# Patient Record
Sex: Female | Born: 1950 | Race: White | Hispanic: No | Marital: Married | Smoking: Current every day smoker
Health system: Southern US, Community
[De-identification: ages and names within clinical notes are randomized; demographics above are authoritative.]

## PROBLEM LIST (undated history)

## (undated) DIAGNOSIS — M797 Fibromyalgia: Secondary | ICD-10-CM

## (undated) DIAGNOSIS — E0500 Thyrotoxicosis with diffuse goiter without thyrotoxic crisis or storm: Secondary | ICD-10-CM

---

## 2008-06-28 ENCOUNTER — Ambulatory Visit: Payer: Self-pay | Admitting: Occupational Medicine

## 2008-06-28 LAB — CONVERTED CEMR LAB
Blood in Urine, dipstick: NEGATIVE
Nitrite: POSITIVE
pH: 8.5

## 2010-03-01 ENCOUNTER — Ambulatory Visit: Payer: Self-pay | Admitting: Emergency Medicine

## 2010-03-01 DIAGNOSIS — J209 Acute bronchitis, unspecified: Secondary | ICD-10-CM

## 2010-07-13 NOTE — Assessment & Plan Note (Signed)
Summary: COUGH,CONGESTION,HEADACHE/TJ   Vital Signs:  Patient Profile:   60 Years Old Female CC:      Cough, congestion, headache x 1 week Height:     65 inches Weight:      195 pounds O2 Sat:      93 % O2 treatment:    Room Air Temp:     97.5 degrees F oral Pulse rate:   104 / minute Pulse rhythm:   regular Resp:     18 per minute BP sitting:   136 / 84  (left arm) Cuff size:   large  Vitals Entered By: Avel Sensor, CMA                  Updated Prior Medication List: SYNTHROID 100 MCG TABS (LEVOTHYROXINE SODIUM) once a day TRILIPIX 135 MG CPDR (CHOLINE FENOFIBRATE) once a day  Current Allergies (reviewed today): ! PENICILLINHistory of Present Illness History from: patient Chief Complaint: Cough, congestion, headache x 1 week History of Present Illness: Cough, chest congestion, nasal congestion, HA, runny nose. No F/C/N/V.  Starting to hurt from all the coughing.  She is taking OTC Mucinex and Robitussin. She cannot take codeine.  Current Meds SYNTHROID 100 MCG TABS (LEVOTHYROXINE SODIUM) once a day ZITHROMAX 250 MG TABS (AZITHROMYCIN) 2 tabs by mouth on day 1, then 1 tab for 10 days TESSALON 200 MG CAPS (BENZONATATE) 1 tab by mouth three times a day as needed for cough MUCUS RELIEF PE 10-400 MG TABS (PHENYLEPHRINE-GUAIFENESIN) 1 tab by mouth three times a day as needed for congestion  REVIEW OF SYSTEMS Constitutional Symptoms      Denies fever, chills, night sweats, weight loss, weight gain, and fatigue.  Eyes       Denies change in vision, eye pain, eye discharge, glasses, contact lenses, and eye surgery. Ear/Nose/Throat/Mouth       Complains of frequent runny nose and hoarseness.      Denies hearing loss/aids, change in hearing, ear pain, ear discharge, dizziness, frequent nose bleeds, sinus problems, sore throat, and tooth pain or bleeding.  Respiratory       Complains of dry cough.      Denies productive cough, wheezing, shortness of breath, asthma, bronchitis,  and emphysema/COPD.  Cardiovascular       Denies murmurs, chest pain, and tires easily with exhertion.    Gastrointestinal       Complains of stomach pain.      Denies nausea/vomiting, diarrhea, constipation, blood in bowel movements, and indigestion. Genitourniary       Denies painful urination, kidney stones, and loss of urinary control. Neurological       Complains of headaches.      Denies paralysis, seizures, and fainting/blackouts. Musculoskeletal       Denies muscle pain, joint pain, joint stiffness, decreased range of motion, redness, swelling, muscle weakness, and gout.  Skin       Denies bruising, unusual mles/lumps or sores, and hair/skin or nail changes.  Psych       Denies mood changes, temper/anger issues, anxiety/stress, speech problems, depression, and sleep problems.  Past History:  Past Medical History: Reviewed history from 06/28/2008 and no changes required. high cholesterol fibromyalgia  Past Surgical History: Reviewed history from 06/28/2008 and no changes required. Hysterectomy  Family History: Reviewed history from 06/28/2008 and no changes required. mothr alive and diabetic father deceased - heart attack two brothers and two sisters alive and healthy  Social History: Reviewed history from 06/28/2008 and no changes required.  2-3 cigarette a day smoker, 25 yrs, ETOH-yes No Drugs Physical Exam General appearance: well developed, well nourished, no acute distress Ears: normal, no lesions or deformities Nasal: mucosa pink, nonedematous, no septal deviation, turbinates normal Oral/Pharynx: tongue normal, posterior pharynx without erythema or exudate Neck: neck supple,  trachea midline, no masses Chest/Lungs: scattered rhonchi, no wheezing Heart: regular rate and  rhythm, no murmur Skin: no obvious rashes or lesions MSE: oriented to time, place, and person Assessment New Problems: BRONCHITIS, ACUTE (ICD-466.0)   Patient Education: Patient  and/or caregiver instructed in the following: rest, fluids, Tylenol prn, quit smoking.  Plan New Medications/Changes: MUCUS RELIEF PE 10-400 MG TABS (PHENYLEPHRINE-GUAIFENESIN) 1 tab by mouth three times a day as needed for congestion  #30 x 0, 03/01/2010, Hoyt Koch MD TESSALON 200 MG CAPS (BENZONATATE) 1 tab by mouth three times a day as needed for cough  #30 x 0, 03/01/2010, Hoyt Koch MD ZITHROMAX 250 MG TABS (AZITHROMYCIN) 2 tabs by mouth on day 1, then 1 tab for 10 days  #12 x 0, 03/01/2010, Hoyt Koch MD  Planning Comments:   If getting worse, may need a CXR, but would wait a few more days. Follow-up with your primary care physician. Take the meds as directed   The patient and/or caregiver has been counseled thoroughly with regard to medications prescribed including dosage, schedule, interactions, rationale for use, and possible side effects and they verbalize understanding.  Diagnoses and expected course of recovery discussed and will return if not improved as expected or if the condition worsens. Patient and/or caregiver verbalized understanding.  Prescriptions: MUCUS RELIEF PE 10-400 MG TABS (PHENYLEPHRINE-GUAIFENESIN) 1 tab by mouth three times a day as needed for congestion  #30 x 0   Entered and Authorized by:   Hoyt Koch MD   Signed by:   Hoyt Koch MD on 03/01/2010   Method used:   Print then Give to Patient   RxID:   [id number] TESSALON 200 MG CAPS (BENZONATATE) 1 tab by mouth three times a day as needed for cough  #30 x 0   Entered and Authorized by:   Hoyt Koch MD   Signed by:   Hoyt Koch MD on 03/01/2010   Method used:   Print then Give to Patient   RxID:   [id number] ZITHROMAX 250 MG TABS (AZITHROMYCIN) 2 tabs by mouth on day 1, then 1 tab for 10 days  #12 x 0   Entered and Authorized by:   Hoyt Koch MD   Signed by:   Hoyt Koch MD on 03/01/2010   Method used:   Print then Give to Patient    RxID:   [id number]

## 2010-07-15 ENCOUNTER — Ambulatory Visit (INDEPENDENT_AMBULATORY_CARE_PROVIDER_SITE_OTHER): Payer: PRIVATE HEALTH INSURANCE | Admitting: Emergency Medicine

## 2010-07-15 ENCOUNTER — Encounter: Payer: Self-pay | Admitting: Emergency Medicine

## 2010-07-15 DIAGNOSIS — J069 Acute upper respiratory infection, unspecified: Secondary | ICD-10-CM

## 2010-07-21 NOTE — Assessment & Plan Note (Signed)
Summary: EAR PAIN/TJ room 5   Vital Signs:  Patient profile:   60 year old female Height:      64 inches Weight:      214.50 pounds BMI:     36.95 O2 Sat:      95 % on Room air Temp:     98.3 degrees F oral Pulse rate:   94 / minute Resp:     18 per minute BP sitting:   145 / 84  (left arm) Cuff size:   large  Vitals Entered By: Clemens Catholic LPN (July 15, 2010 3:21 PM)  O2 Flow:  Room air CC: ear ache/ sore throat Is Patient Diabetic? No Comments pt c/o bilateral ear pain, popping and cracking sound in her ears,  and sore throat x 5 days. she has tried OTC ear gtts (oil).   Current Medications (verified): 1)  Synthroid 100 Mcg Tabs (Levothyroxine Sodium) .... Once A Day  Allergies: 1)  ! Penicillin 2)  ! Jonne Ply  Past History:  Past Medical History: high cholesterol fibromyalgia hypothyroidism  Past Surgical History: Reviewed history from 06/28/2008 and no changes required. Hysterectomy  Family History: Reviewed history from 06/28/2008 and no changes required. mothr alive and diabetic father deceased - heart attack two brothers and two sisters alive and healthy  Social History: Reviewed history from 03/01/2010 and no changes required. 2-3 cigarette a day smoker, 25 yrs, ETOH-yes No Drugs  Physical Exam  General:  Well-developed,well-nourished,in no acute distress; alert,appropriate and cooperative throughout examination Head:  mild maxillary sinus tenderness Ears:  External ear exam shows no significant lesions or deformities.  Otoscopic examination reveals clear canals, tympanic membranes are intact bilaterally without bulging, retraction, inflammation or discharge. Hearing is grossly normal bilaterally. Nose:  clear discharge Mouth:  clear PnD< no erythema, no exudates, OP patent Neck:  no ant cerv LAD Lungs:  Normal respiratory effort, chest expands symmetrically. Lungs are clear to auscultation, no crackles or wheezes. Heart:  Normal rate and  regular rhythm. S1 and S2 normal without gallop, murmur, click, rub or other extra sounds.  History of Present Illness History from: patient Chief Complaint: ear ache/ sore throat History of Present Illness: 60 Years Old Female complains of onset of cold symptoms for 6 days.  Jeimy has been using Tylenol, nasal spray which is helping a little bit. + sore throat + cough No pleuritic pain No wheezing + nasal congestion + post-nasal drainage + sinus pain/pressure No chest congestion No itchy/red eyes + earache No hemoptysis No SOB No chills/sweats No fever No nausea No vomiting No abdominal pain No diarrhea No skin rashes No fatigue No myalgias No headache     Complete Medication List: 1)  Synthroid 100 Mcg Tabs (Levothyroxine sodium) .... Once a day 2)  Zithromax Z-pak 250 Mg Tabs (Azithromycin) .... Use as directed  Other Orders: Pulse Oximetry (single measurment) (21308) Prescriptions: ZITHROMAX Z-PAK 250 MG TABS (AZITHROMYCIN) use as directed  #1 x 0   Entered and Authorized by:   Hoyt Koch MD   Signed by:   Hoyt Koch MD on 07/15/2010   Method used:   Print then Give to Patient   RxID:   [id number]    Orders Added: 1)  Est. Patient Level III [24401] 2)  Pulse Oximetry (single measurment) [94760]    Laboratory Results  Date/Time Received: July 15, 2010 3:25 PM  Date/Time Reported: July 15, 2010 3:25 PM   Other Tests  Rapid Strep: negative  Kit  Test Internal QC: Negative   (Normal Range: Negative)

## 2010-08-02 DIAGNOSIS — J069 Acute upper respiratory infection, unspecified: Secondary | ICD-10-CM

## 2011-05-07 ENCOUNTER — Emergency Department (INDEPENDENT_AMBULATORY_CARE_PROVIDER_SITE_OTHER)
Admission: EM | Admit: 2011-05-07 | Discharge: 2011-05-07 | Disposition: A | Discharge status: Home / Self Care | Payer: PRIVATE HEALTH INSURANCE | Source: Home / Self Care | Attending: Family Medicine | Admitting: Family Medicine

## 2011-05-07 DIAGNOSIS — J069 Acute upper respiratory infection, unspecified: Secondary | ICD-10-CM

## 2011-05-07 DIAGNOSIS — R05 Cough: Secondary | ICD-10-CM

## 2011-05-07 HISTORY — DX: Thyrotoxicosis with diffuse goiter without thyrotoxic crisis or storm: E05.00

## 2011-05-07 LAB — POCT CBC W AUTO DIFF (K'VILLE URGENT CARE)

## 2011-05-07 MED ORDER — AZITHROMYCIN 250 MG PO TABS: ORAL_TABLET | ORAL | Status: AC

## 2011-05-07 MED ORDER — ALBUTEROL SULFATE HFA 108 (90 BASE) MCG/ACT IN AERS: 2.0000 | INHALATION_SPRAY | RESPIRATORY_TRACT | Status: DC | PRN

## 2011-05-07 MED ORDER — BENZONATATE 200 MG PO CAPS: 200.0000 mg | ORAL_CAPSULE | Freq: Every day | ORAL | Status: AC

## 2011-05-07 NOTE — ED Provider Notes (Signed)
History     CSN: [id number] Arrival date & time: 05/07/2011  1:21 PM   First MD Initiated Contact with Patient 05/07/11 1333      Chief Complaint  Patient presents with  . Fever  . Cough     HPI Comments: Patient complains of one day history of gradually progressive URI symptoms beginning with a mild sore throat (now improved), followed by progressive nasal congestion.  A cough started today. Complains of fatigue and initial myalgias.  Cough is generally productive during the day.  There has been no pleuritic pain.  She has not had a flu shot.  She had pneumonia last year.  Patient is a 60 y.o. female presenting with URI. The history is provided by the patient.  URI The primary symptoms include fatigue, headaches, ear pain, sore throat, cough, wheezing and myalgias. Primary symptoms do not include fever, swollen glands, abdominal pain, nausea, vomiting, arthralgias or rash. The current episode started yesterday. This is a new problem.  Symptoms associated with the illness include chills, plugged ear sensation, sinus pressure, congestion and rhinorrhea. The illness is not associated with facial pain.    Past Medical History  Diagnosis Date  . Grave's disease     History reviewed. No pertinent past surgical history.  History reviewed. No pertinent family history.  History  Substance Use Topics  . Smoking status: Current Everyday Smoker  . Smokeless tobacco: Not on file  . Alcohol Use: No    OB History    Grav Para Term Preterm Abortions TAB SAB Ect Mult Living                  Review of Systems  Constitutional: Positive for chills and fatigue. Negative for fever.  HENT: Positive for ear pain, congestion, sore throat, rhinorrhea, postnasal drip and sinus pressure. Negative for nosebleeds.   Eyes: Negative.   Respiratory: Positive for cough, chest tightness and wheezing.   Cardiovascular: Negative.   Gastrointestinal: Negative for nausea, vomiting and abdominal pain.    Genitourinary: Negative.   Musculoskeletal: Positive for myalgias. Negative for arthralgias.  Skin: Negative.  Negative for rash.  Neurological: Positive for headaches.    Allergies  Aspirin and Penicillins  Home Medications   Current Outpatient Rx  Name Route Sig Dispense Refill  . LEVOTHYROXINE SODIUM 100 MCG PO TABS Oral Take 100 mcg by mouth daily.      . ALBUTEROL SULFATE HFA 108 (90 BASE) MCG/ACT IN AERS Inhalation Inhale 2 puffs into the lungs every 4 (four) hours as needed for wheezing. 1 Inhaler 0  . AZITHROMYCIN 250 MG PO TABS  Take 2 tabs today; then begin one tab once daily for 4 more days.    6 each 0  . BENZONATATE 200 MG PO CAPS Oral Take 1 capsule (200 mg total) by mouth at bedtime. Take as needed for cough 12 capsule 0    BP 110/74  Pulse 98  Temp(Src) 98.1 F (36.7 C) (Oral)  Resp 24  Ht 5\' 3"  (1.6 m)  Wt 184 lb 8 oz (83.689 kg)  BMI 32.68 kg/m2  SpO2 94%  Physical Exam  Nursing note and vitals reviewed. Constitutional: She is oriented to person, place, and time. She appears well-developed and well-nourished. No distress.       Patient is obese (BMI 32.8 )   HENT:  Head: Normocephalic and atraumatic.  Right Ear: Tympanic membrane and external ear normal.  Left Ear: Tympanic membrane and external ear normal.  Nose: Nose normal.  Mouth/Throat: Oropharynx is clear and moist. No oropharyngeal exudate.  Eyes: Conjunctivae and EOM are normal. Pupils are equal, round, and reactive to light. Right eye exhibits no discharge. Left eye exhibits no discharge. No scleral icterus.  Neck: Neck supple.       There is distinct tenderness over slightly enlarged bilateral posterior cervical nodes.   Cardiovascular: Normal rate, regular rhythm and normal heart sounds.   Pulmonary/Chest: No respiratory distress. She has no wheezes. She has rhonchi. She has no rales. She exhibits tenderness.       Chest:  Distinct tenderness to palpation over the mid-sternum.   Abdominal:  Soft. There is no tenderness.  Musculoskeletal: She exhibits no edema and no tenderness.  Lymphadenopathy:    She has cervical adenopathy.  Neurological: She is alert and oriented to person, place, and time.  Skin: Skin is warm and dry. No rash noted.    ED Course  Procedures None   Labs Reviewed  POCT CBC W AUTO DIFF ([facility]):  CBC:  WBC 5.2; LY 18.8; MO 9.1; GR 72.1; Hgb 15.3 Influenza A & B:  Negative       1. Acute upper respiratory infections of unspecified site       MDM  Although present illness appears to be viral, patient is at risk for recurrent pneumonia. Begin Azithromycin. Begin expectorant/decongestant, topical decongestant, saline nasal spray and/or saline irrigation, and cough suppressant at bedtime.  Avoid antihistamines for now. Increase fluid intake, rest.  Recommend Flu shot and pneumococcal vaccine when well. Follow-up with family doctor if not improving 7 days.        Donna Christen, MD 05/10/11 2039

## 2013-08-05 ENCOUNTER — Encounter: Payer: Self-pay | Admitting: Emergency Medicine

## 2013-08-05 ENCOUNTER — Emergency Department (INDEPENDENT_AMBULATORY_CARE_PROVIDER_SITE_OTHER): Payer: PRIVATE HEALTH INSURANCE

## 2013-08-05 ENCOUNTER — Emergency Department (INDEPENDENT_AMBULATORY_CARE_PROVIDER_SITE_OTHER)
Admission: EM | Admit: 2013-08-05 | Discharge: 2013-08-05 | Disposition: A | Discharge status: Home / Self Care | Payer: PRIVATE HEALTH INSURANCE | Source: Home / Self Care | Attending: Family Medicine | Admitting: Family Medicine

## 2013-08-05 DIAGNOSIS — R0789 Other chest pain: Secondary | ICD-10-CM

## 2013-08-05 DIAGNOSIS — R059 Cough, unspecified: Secondary | ICD-10-CM

## 2013-08-05 DIAGNOSIS — J209 Acute bronchitis, unspecified: Secondary | ICD-10-CM

## 2013-08-05 DIAGNOSIS — R05 Cough: Secondary | ICD-10-CM

## 2013-08-05 HISTORY — DX: Fibromyalgia: M79.7

## 2013-08-05 LAB — POCT CBC W AUTO DIFF (K'VILLE URGENT CARE)

## 2013-08-05 MED ORDER — PREDNISONE 20 MG PO TABS: 20.0000 mg | ORAL_TABLET | Freq: Two times a day (BID) | ORAL | Status: DC

## 2013-08-05 MED ORDER — AZITHROMYCIN 250 MG PO TABS: ORAL_TABLET | ORAL | Status: DC

## 2013-08-05 NOTE — ED Notes (Signed)
Emily Gilmore c/o onset cough, fatigue and body aches 9 days ago. 2 days later she went to [facility] ER for eval. She was +for influenza B. CXR done, WNL, no Tamiflu. She feels the fatigue is worsening and the cough is still lingering. Fever has resolved. No flu vac this season.

## 2013-08-05 NOTE — ED Provider Notes (Signed)
CSN: [id number]     Arrival date & time 08/05/13  1223 History   First MD Initiated Contact with Patient 08/05/13 1320     Chief Complaint  Patient presents with  . Cough  . Fatigue       HPI Comments: Patient developed flu-like symptoms 9 days ago.  Two days later she had a positive test for influenza B.  Chest X-ray was negative.  She did not receive Tamiflu. She feels increasing fatigue, and her cough has persisted and is worse at night.  She complains of pain in her right lateral chest with cough.  Recently she has had sweats, and loose stools over the past 5 days. She has a history of asthma, and had pneumonia two years ago.  The history is provided by the patient.    Past Medical History  Diagnosis Date  . Grave's disease   . Fibromyalgia    History reviewed. No pertinent past surgical history. History reviewed. No pertinent family history. History  Substance Use Topics  . Smoking status: Current Every Day Smoker  . Smokeless tobacco: Never Used  . Alcohol Use: No   OB History   Grav Para Term Preterm Abortions TAB SAB Ect Mult Living                 Review of Systems No sore throat + cough ? pleuritic pain on right No wheezing + nasal congestion + post-nasal drainage No sinus pain/pressure No itchy/red eyes No earache No hemoptysis No SOB No fever, + chills/sweats No nausea No vomiting No abdominal pain + diarrhea No urinary symptoms No skin rash + fatigue No myalgias + headache Used OTC meds without relief    Allergies  Aspirin and Penicillins  Home Medications   Current Outpatient Rx  Name  Route  Sig  Dispense  Refill  . azithromycin (ZITHROMAX Z-PAK) 250 MG tablet      Take 2 tabs today; then begin one tab once daily for 4 more days.   6 each   0   . levothyroxine (SYNTHROID, LEVOTHROID) 100 MCG tablet   Oral   Take 100 mcg by mouth daily.           . predniSONE (DELTASONE) 20 MG tablet   Oral   Take 1 tablet (20 mg total) by  mouth 2 (two) times daily. Take with food.   10 tablet   0    BP 152/87  Pulse 78  Temp(Src) 97.7 F (36.5 C) (Tympanic)  Resp 16  Wt 194 lb (87.998 kg)  SpO2 94% Physical Exam Nursing notes and Vital Signs reviewed. Appearance:  Patient appears stated age, and in no acute distress Eyes:  Pupils are equal, round, and reactive to light and accomodation.  Extraocular movement is intact.  Conjunctivae are not inflamed  Ears:  Canals normal.  Tympanic membranes normal.  Nose:  Mildly congested turbinates.  No sinus tenderness.  Pharynx:  Normal Neck:  Supple.   Tender posterior nodes are palpated bilaterally  Lungs:   Rhonchi/faint wheezes right base.  Breath sounds are equal. Chest:  Tenderness right inferior costal margin  Heart:  Regular rate and rhythm without murmurs, rubs, or gallops.  Abdomen:  Nontender without masses or hepatosplenomegaly.  Bowel sounds are present.  No CVA or flank tenderness.  Extremities:  No edema.  No calf tenderness Skin:  No rash present.   ED Course  Procedures  none    Labs Reviewed  POCT CBC W AUTO DIFF (  [facility])  WBC:  7.5; LY 38.4; MO 6.0; GR 55.6; Hgb 14.4; Platelets 247    Imaging Review Dg Chest 2 View  08/05/2013   CLINICAL DATA:  Cough and congestion.  Flu-like symptoms for 1 week.  EXAM: CHEST  2 VIEW  COMPARISON:  None.  FINDINGS: Mild pectus excavatum deformity. Midline trachea. Normal heart size and mediastinal contours. No pleural effusion or pneumothorax. Clear lungs.  IMPRESSION: No acute cardiopulmonary disease.   Electronically Signed   By: Jeronimo Greaves M.D.   On: 08/05/2013 13:58      MDM   Final diagnoses:  Resolving influenza.  Suspect new onset URI vs acute bronchitis.  Normal white blood count and negative chest X-ray are reassuring.    Begin prednisone burst.  With a past history of pneumonia, will cover for atypicals with a Z-pack Take plain Mucinex (1200 mg guaifenesin) twice daily for cough and  congestion. Increase fluid intake, rest.  Also recommend using saline nasal spray several times daily and saline nasal irrigation (AYR is a common brand) Try warm salt water gargles for sore throat.  Stop all antihistamines for now, and other non-prescription cough/cold preparations. May take Ibuprofen 200mg , 4 tabs every 8 hours with food for headache, fever, etc. May take Delsym Cough Suppressant at bedtime for nighttime cough.  Follow-up with family doctor if not improving 7 to 10 days.    Lattie Haw, MD 08/07/13 [id number]

## 2013-08-05 NOTE — Discharge Instructions (Signed)
Take plain Mucinex (1200 mg guaifenesin) twice daily for cough and congestion. Increase fluid intake, rest.  Also recommend using saline nasal spray several times daily and saline nasal irrigation (AYR is a common brand) Try warm salt water gargles for sore throat.  Stop all antihistamines for now, and other non-prescription cough/cold preparations. May take Ibuprofen 200mg , 4 tabs every 8 hours with food for headache, fever, etc. May take Delsym Cough Suppressant at bedtime for nighttime cough.  Follow-up with family doctor if not improving 7 to 10 days.

## 2014-06-22 ENCOUNTER — Emergency Department (INDEPENDENT_AMBULATORY_CARE_PROVIDER_SITE_OTHER)
Admission: EM | Admit: 2014-06-22 | Discharge: 2014-06-22 | Disposition: A | Discharge status: Home / Self Care | Payer: PRIVATE HEALTH INSURANCE | Source: Home / Self Care | Attending: Family Medicine | Admitting: Family Medicine

## 2014-06-22 DIAGNOSIS — N3001 Acute cystitis with hematuria: Secondary | ICD-10-CM

## 2014-06-22 MED ORDER — CIPROFLOXACIN HCL 500 MG PO TABS: 500.0000 mg | ORAL_TABLET | Freq: Two times a day (BID) | ORAL | Status: DC

## 2014-06-22 MED ORDER — FLUCONAZOLE 150 MG PO TABS: 150.0000 mg | ORAL_TABLET | Freq: Once | ORAL | Status: DC

## 2014-06-22 NOTE — ED Notes (Signed)
Painful urination started last week

## 2014-06-22 NOTE — Discharge Instructions (Signed)
Thank you for coming in today. Take Cipro twice daily for one week. Use fluconazole if you develop a yeast infection   Urinary Tract Infection Urinary tract infections (UTIs) can develop anywhere along your urinary tract. Your urinary tract is your body's drainage system for removing wastes and extra water. Your urinary tract includes two kidneys, two ureters, a bladder, and a urethra. Your kidneys are a pair of bean-shaped organs. Each kidney is about the size of your fist. They are located below your ribs, one on each side of your spine. CAUSES Infections are caused by microbes, which are microscopic organisms, including fungi, viruses, and bacteria. These organisms are so small that they can only be seen through a microscope. Bacteria are the microbes that most commonly cause UTIs. SYMPTOMS  Symptoms of UTIs may vary by age and gender of the patient and by the location of the infection. Symptoms in young women typically include a frequent and intense urge to urinate and a painful, burning feeling in the bladder or urethra during urination. Older women and men are more likely to be tired, shaky, and weak and have muscle aches and abdominal pain. A fever may mean the infection is in your kidneys. Other symptoms of a kidney infection include pain in your back or sides below the ribs, nausea, and vomiting. DIAGNOSIS To diagnose a UTI, your caregiver will ask you about your symptoms. Your caregiver also will ask to provide a urine sample. The urine sample will be tested for bacteria and white blood cells. White blood cells are made by your body to help fight infection. TREATMENT  Typically, UTIs can be treated with medication. Because most UTIs are caused by a bacterial infection, they usually can be treated with the use of antibiotics. The choice of antibiotic and length of treatment depend on your symptoms and the type of bacteria causing your infection. HOME CARE INSTRUCTIONS  If you were  prescribed antibiotics, take them exactly as your caregiver instructs you. Finish the medication even if you feel better after you have only taken some of the medication.  Drink enough water and fluids to keep your urine clear or pale yellow.  Avoid caffeine, tea, and carbonated beverages. They tend to irritate your bladder.  Empty your bladder often. Avoid holding urine for long periods of time.  Empty your bladder before and after sexual intercourse.  After a bowel movement, women should cleanse from front to back. Use each tissue only once. SEEK MEDICAL CARE IF:   You have back pain.  You develop a fever.  Your symptoms do not begin to resolve within 3 days. SEEK IMMEDIATE MEDICAL CARE IF:   You have severe back pain or lower abdominal pain.  You develop chills.  You have nausea or vomiting.  You have continued burning or discomfort with urination. MAKE SURE YOU:   Understand these instructions.  Will watch your condition.  Will get help right away if you are not doing well or get worse. Document Released: 03/09/2005 Document Revised: 11/29/2011 Document Reviewed: 07/08/2011 [facility] Patient Information 2015 [place] [employer], [place]. This information is not intended to replace advice given to you by your health care provider. Make sure you discuss any questions you have with your health care provider.

## 2014-06-22 NOTE — ED Provider Notes (Signed)
Marcial PacasJean Desilva is a 64 y.o. female who presents to Urgent Care today for burning with urination and urinary frequency and urgency. Symptoms present for one week. Symptoms are consistent with prior UTI. Patient has tried AZO which helps. No fevers or chills vomiting or diarrhea.   Past Medical History  Diagnosis Date  . Grave's disease   . Fibromyalgia    History reviewed. No pertinent past surgical history. History  Substance Use Topics  . Smoking status: Current Every Day Smoker  . Smokeless tobacco: Never Used  . Alcohol Use: No   ROS as above Medications: No current facility-administered medications for this encounter.   Current Outpatient Prescriptions  Medication Sig Dispense Refill  . ciprofloxacin (CIPRO) 500 MG tablet Take 1 tablet (500 mg total) by mouth every 12 (twelve) hours. 14 tablet 0  . fluconazole (DIFLUCAN) 150 MG tablet Take 1 tablet (150 mg total) by mouth once. 1 tablet 1  . levothyroxine (SYNTHROID, LEVOTHROID) 100 MCG tablet Take 100 mcg by mouth daily.       Allergies  Allergen Reactions  . Aspirin   . Penicillins      Exam:  BP 141/84 mmHg  Pulse 84  Temp(Src) 97.4 F (36.3 C) (Oral)  Ht 5\' 4"  (1.626 m)  Wt 211 lb (95.709 kg)  BMI 36.20 kg/m2  SpO2 97% Gen: Well NAD HEENT: EOMI,  MMM Lungs: Normal work of breathing. CTABL Heart: RRR no MRG Abd: NABS, Soft. Nondistended, Nontender no CV angle tenderness to percussion Exts: Brisk capillary refill, warm and well perfused.   No results found for this or any previous visit (from the past 24 hour(s)). No results found.  Assessment and Plan: 64 y.o. female with UTI. Culture pending. Treat with Cipro as patient is allergic to penicillin.  Discussed warning signs or symptoms. Please see discharge instructions. Patient expresses understanding.     Rodolph BongEvan S Reggie Bise, MD 06/22/14 [phone]

## 2014-06-25 LAB — URINE CULTURE: Colony Count: >=100000

## 2014-06-26 ENCOUNTER — Telehealth: Payer: Self-pay | Admitting: Emergency Medicine

## 2014-08-23 IMAGING — CR DG CHEST 2V
2 series · 2 of 2 positions shown · non-contrast
Comparison: None.

CLINICAL DATA: Cough and congestion.  Flu-like symptoms for 1 week.

EXAM:
CHEST  2 VIEW

[view not recorded (1 of 2)]
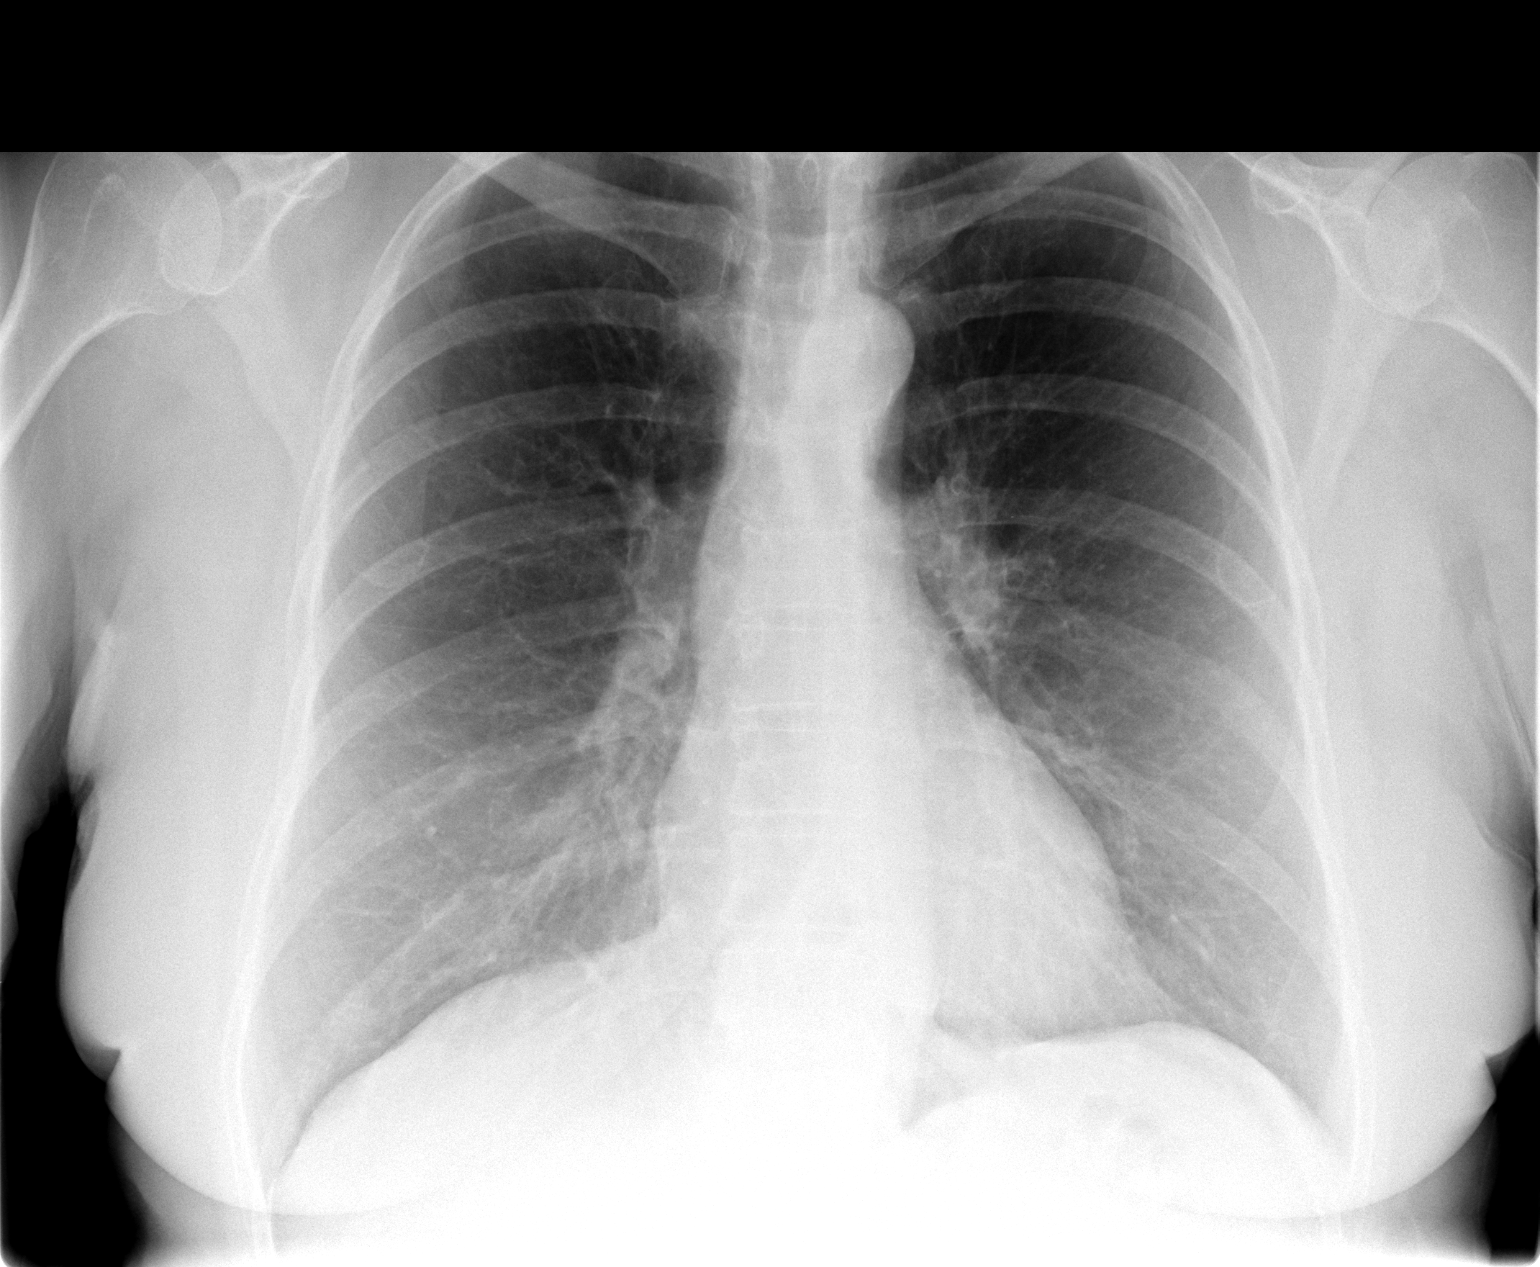

[view not recorded (2 of 2)]
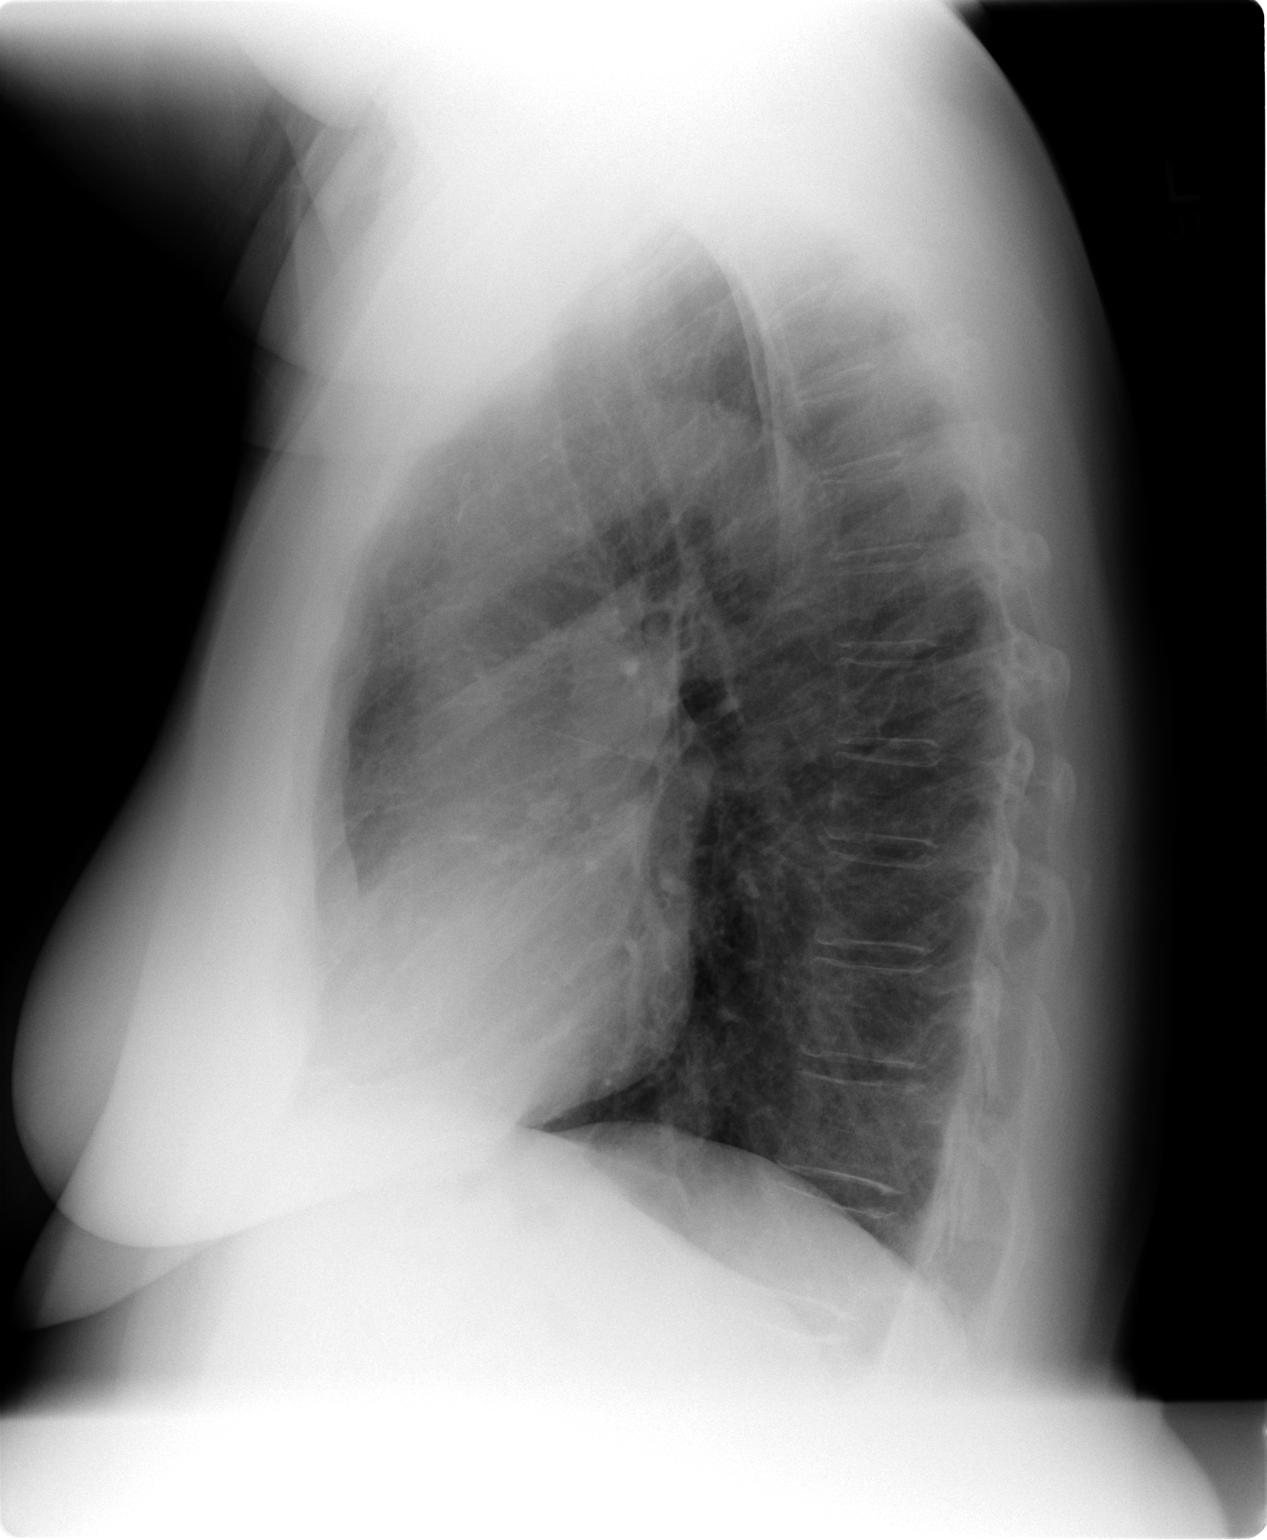

[2 of 2 positions shown; findings below may reference images not displayed]

FINDINGS: Mild pectus excavatum deformity. Midline trachea. Normal heart size
and mediastinal contours. No pleural effusion or pneumothorax. Clear
lungs.
IMPRESSION: No acute cardiopulmonary disease.

## 2015-01-24 ENCOUNTER — Emergency Department (INDEPENDENT_AMBULATORY_CARE_PROVIDER_SITE_OTHER)
Admission: EM | Admit: 2015-01-24 | Discharge: 2015-01-24 | Disposition: A | Discharge status: Home / Self Care | Payer: PRIVATE HEALTH INSURANCE | Source: Home / Self Care | Attending: Family Medicine | Admitting: Family Medicine

## 2015-01-24 ENCOUNTER — Encounter: Payer: Self-pay | Admitting: *Deleted

## 2015-01-24 DIAGNOSIS — J209 Acute bronchitis, unspecified: Secondary | ICD-10-CM | POA: Diagnosis not present

## 2015-01-24 MED ORDER — AZITHROMYCIN 250 MG PO TABS
250.0000 mg | ORAL_TABLET | Freq: Every day | ORAL | Status: DC
Start: 2015-01-24 — End: 2015-01-31

## 2015-01-24 MED ORDER — BENZONATATE 100 MG PO CAPS: 100.0000 mg | ORAL_CAPSULE | Freq: Three times a day (TID) | ORAL | Status: DC

## 2015-01-24 MED ORDER — ALBUTEROL SULFATE HFA 108 (90 BASE) MCG/ACT IN AERS: 1.0000 | INHALATION_SPRAY | Freq: Four times a day (QID) | RESPIRATORY_TRACT | Status: DC | PRN

## 2015-01-24 MED ORDER — PREDNISONE 20 MG PO TABS
ORAL_TABLET | ORAL | Status: DC
Start: 2015-01-24 — End: 2015-01-31

## 2015-01-24 NOTE — ED Provider Notes (Signed)
CSN: [id number]     Arrival date & time 01/24/15  1408 History   First MD Initiated Contact with Patient 01/24/15 1422     Chief Complaint  Patient presents with  . Cough  . Nasal Congestion   (Consider location/radiation/quality/duration/timing/severity/associated sxs/prior Treatment) HPI The pt is a 64yo female presenting to [facility] with c/o gradually worsening, persistent, URI symptoms including nasal congestion, cough, sore throat, and intermittent chills with Tmax of 102.  Fever has been controlled with Advil and Tylenol, however, cough keeps pt up at night. She has tried OTC cough medications- mucinex, robitussin and Delsym w/o relief. Pt is a daily smoker but states she has been unable to smoke for 1 week due to the cough.  Pt states her husband is sick but not as severe and she was sick first. No recent travel. She reports mild chest tightness and soreness from coughing. Reports hx of asthma as a child but does not have an inhaler so she has been using her husband's to help her breat better at night.  Denies hx of COPD.   Past Medical History  Diagnosis Date  . Grave's disease   . Fibromyalgia    History reviewed. No pertinent past surgical history. History reviewed. No pertinent family history. Social History  Substance Use Topics  . Smoking status: Current Every Day Smoker  . Smokeless tobacco: Never Used  . Alcohol Use: No   OB History    No data available     Review of Systems  Constitutional: Positive for fever, chills and fatigue.  HENT: Positive for congestion. Negative for ear pain, sore throat, trouble swallowing and voice change.   Respiratory: Positive for cough, chest tightness, shortness of breath and wheezing.   Cardiovascular: Positive for chest pain. Negative for palpitations.  Gastrointestinal: Negative for nausea, vomiting, abdominal pain and diarrhea.  Musculoskeletal: Negative for myalgias, back pain and arthralgias.  Skin: Negative for rash.  All other  systems reviewed and are negative.   Allergies  Aspirin and Penicillins  Home Medications   Prior to Admission medications   Medication Sig Start Date End Date Taking? Authorizing Provider  albuterol (PROVENTIL HFA;VENTOLIN HFA) 108 (90 BASE) MCG/ACT inhaler Inhale 1-2 puffs into the lungs every 6 (six) hours as needed for wheezing or shortness of breath. 01/24/15   Junius Finner, PA-C  azithromycin (ZITHROMAX) 250 MG tablet Take 1 tablet (250 mg total) by mouth daily. Take first 2 tablets together, then 1 every day until finished. 01/24/15   Junius Finner, PA-C  benzonatate (TESSALON) 100 MG capsule Take 1 capsule (100 mg total) by mouth every 8 (eight) hours. 01/24/15   Junius Finner, PA-C  levothyroxine (SYNTHROID, LEVOTHROID) 100 MCG tablet Take 100 mcg by mouth daily.      Historical Provider, MD  predniSONE (DELTASONE) 20 MG tablet 3 tabs po day one, then 2 po daily x 4 days 01/24/15   Junius Finner, PA-C   BP 114/80 mmHg  Pulse 85  Temp(Src) 98.2 F (36.8 C) (Oral)  Resp 16  Wt 188 lb (85.276 kg)  SpO2 97% Physical Exam  Constitutional: She appears well-developed and well-nourished. No distress.  HENT:  Head: Normocephalic and atraumatic.  Right Ear: Hearing, tympanic membrane, external ear and ear canal normal.  Left Ear: Hearing, tympanic membrane, external ear and ear canal normal.  Nose: Mucosal edema and rhinorrhea present.  Mouth/Throat: Uvula is midline, oropharynx is clear and moist and mucous membranes are normal.  Eyes: Conjunctivae are normal. No scleral icterus.  Neck: Normal range of motion. Neck supple.  Cardiovascular: Normal rate, regular rhythm and normal heart sounds.   Pulmonary/Chest: Effort normal. No respiratory distress. She has wheezes. She has no rales. She exhibits no tenderness.  Intermittent dry cough during exam. No respiratory distress. Lungs: faint expiratory wheeze in lower lung fields. No rhonchi.  Abdominal: Soft. Bowel sounds are normal. She  exhibits no distension and no mass. There is no tenderness. There is no rebound and no guarding.  Musculoskeletal: Normal range of motion.  Neurological: She is alert.  Skin: Skin is warm and dry. She is not diaphoretic.  Nursing note and vitals reviewed.   ED Course  Procedures (including critical care time) Labs Review Labs Reviewed - No data to display  Imaging Review No results found.   MDM   1. Acute bronchitis, unspecified organism     Pt c/o worsening persistent URI symptoms for 2 weeks. No improvement with OTC medications. Tmax 102 at home. Pt is afebrile and non-toxic appearing in UC. No respiratory distress, however, pt does have faint expiratory wheeze in lower lung fiends and dry intermittent cough. Due to hx of smoking and duration of symptoms will start pt on antibiotic for bacterial cause of symptoms. Rx: Azithromycin, prednisone x 5 days, albuterol inhaler as needed, and tessalon. Advised pt to continue tylenol and Advil as needed for fever and pain. May also continue Mucinex.  Encouraged rest and fluids. F/u with PCP in 1 week if not improving. Return precautions provided. Pt verbalized understanding and agreement with tx plan.     Junius Finner, PA-C 01/24/15 1443

## 2015-01-24 NOTE — ED Notes (Signed)
Pt c/o cough, congestion, HA x 2 weeks. Intermittent chills. Taken Tylenol, Advil, robitussin and Delsym.

## 2015-01-24 NOTE — Discharge Instructions (Signed)

## 2015-01-31 ENCOUNTER — Encounter: Payer: Self-pay | Admitting: Emergency Medicine

## 2015-01-31 ENCOUNTER — Emergency Department (INDEPENDENT_AMBULATORY_CARE_PROVIDER_SITE_OTHER): Payer: PRIVATE HEALTH INSURANCE

## 2015-01-31 ENCOUNTER — Emergency Department (INDEPENDENT_AMBULATORY_CARE_PROVIDER_SITE_OTHER)
Admission: EM | Admit: 2015-01-31 | Discharge: 2015-01-31 | Disposition: A | Discharge status: Home / Self Care | Payer: PRIVATE HEALTH INSURANCE | Source: Home / Self Care | Attending: Family Medicine | Admitting: Family Medicine

## 2015-01-31 DIAGNOSIS — R05 Cough: Secondary | ICD-10-CM

## 2015-01-31 DIAGNOSIS — J209 Acute bronchitis, unspecified: Secondary | ICD-10-CM

## 2015-01-31 MED ORDER — DOXYCYCLINE HYCLATE 100 MG PO CAPS: 100.0000 mg | ORAL_CAPSULE | Freq: Two times a day (BID) | ORAL | Status: AC

## 2015-01-31 MED ORDER — ALBUTEROL SULFATE HFA 108 (90 BASE) MCG/ACT IN AERS: 1.0000 | INHALATION_SPRAY | Freq: Four times a day (QID) | RESPIRATORY_TRACT | Status: AC | PRN

## 2015-01-31 MED ORDER — TIOTROPIUM BROMIDE MONOHYDRATE 18 MCG IN CAPS: ORAL_CAPSULE | RESPIRATORY_TRACT | Status: AC

## 2015-01-31 MED ORDER — BENZONATATE 200 MG PO CAPS: 200.0000 mg | ORAL_CAPSULE | Freq: Every day | ORAL | Status: AC

## 2015-01-31 NOTE — ED Notes (Signed)
Pt c/o productive cough, congestion, seen 1 week ago given Z-Pack, steroids and inhaler.  Pt was feeling better on meds then on Thursday started with the body chills and worsen cough.

## 2015-01-31 NOTE — ED Provider Notes (Signed)
CSN: [id number]     Arrival date & time 01/31/15  1113 History   First MD Initiated Contact with Patient 01/31/15 1144     Chief Complaint  Patient presents with  . Cough      HPI Comments: Patient states that she had a URI that began 2 weeks ago, and finally felt better 3 days ago.  However, last night she developed fever to 102, and 101 this morning.  She has been fatigued, and complains of tightness in her anterior chest.  She has developed increased wheezing and shortness of breath with activity.  She notes that her husband's Spiriva helps, and requests an Rx.  She continues to smoke.  The history is provided by the patient.    Past Medical History  Diagnosis Date  . Grave's disease   . Fibromyalgia    History reviewed. No pertinent past surgical history. History reviewed. No pertinent family history. Social History  Substance Use Topics  . Smoking status: Current Every Day Smoker  . Smokeless tobacco: Never Used  . Alcohol Use: Yes     Comment: socially   OB History    No data available     Review of Systems + sore throat + cough No pleuritic pain + wheezing No nasal congestion No post-nasal drainage No sinus pain/pressure No itchy/red eyes No earache No hemoptysis + SOB + fever, + chills No nausea No vomiting No abdominal pain No diarrhea No urinary symptoms No skin rash + fatigue No myalgias + headache Used OTC meds without relief  Allergies  Aspirin and Penicillins  Home Medications   Prior to Admission medications   Medication Sig Start Date End Date Taking? Authorizing Provider  albuterol (PROVENTIL HFA;VENTOLIN HFA) 108 (90 BASE) MCG/ACT inhaler Inhale 1-2 puffs into the lungs every 6 (six) hours as needed for wheezing or shortness of breath. 01/31/15   Lattie Haw, MD  benzonatate (TESSALON) 200 MG capsule Take 1 capsule (200 mg total) by mouth at bedtime. Take as needed for cough 01/31/15   Lattie Haw, MD  doxycycline (VIBRAMYCIN) 100  MG capsule Take 1 capsule (100 mg total) by mouth 2 (two) times daily. Take with food. 01/31/15   Lattie Haw, MD  levothyroxine (SYNTHROID, LEVOTHROID) 100 MCG tablet Take 100 mcg by mouth daily.      Historical Provider, MD  tiotropium (SPIRIVA HANDIHALER) 18 MCG inhalation capsule Place one cap into inhaler and inhale one cap, once daily 01/31/15   Lattie Haw, MD   BP 128/80 mmHg  Pulse 114  Temp(Src) 98.3 F (36.8 C) (Oral)  Ht 5\' 4"  (1.626 m)  Wt 189 lb (85.73 kg)  BMI 32.43 kg/m2  SpO2 98% Physical Exam Nursing notes and Vital Signs reviewed. Appearance:  Patient appears stated age, and in no acute distress.  Patient is obese (BMI 32.4) Eyes:  Pupils are equal, round, and reactive to light and accomodation.  Extraocular movement is intact.  Conjunctivae are not inflamed  Ears:  Canals normal.  Tympanic membranes normal.  Nose:  Mildly congested turbinates.  No sinus tenderness.   Pharynx:  Normal Neck:  Supple.  Tender enlarged posterior nodes are palpated bilaterally  Lungs:   Amphoric breath sounds.  Breath sounds are equal.  Moving air well. Heart:  Regular rate and rhythm without murmurs, rubs, or gallops.  Abdomen:  Nontender without masses or hepatosplenomegaly.  Bowel sounds are present.  No CVA or flank tenderness.  Extremities:  No edema.  No calf  tenderness Skin:  No rash present.   ED Course  Procedures  None   Imaging Review Dg Chest 2 View  01/31/2015   CLINICAL DATA:  Persisting cough for 2 weeks. Cough and congestion. Recent development of fever.  EXAM: CHEST  2 VIEW  COMPARISON:  08/05/2013.  FINDINGS: Cardiopericardial silhouette within normal limits. Mediastinal contours normal. Trachea midline. No airspace disease or effusion. Mild tortuosity of the thoracic aorta with arch atherosclerosis.  IMPRESSION: No interval change or acute cardiopulmonary disease.   Electronically Signed   By: Andreas Newport M.D.   On: 01/31/2015 12:22     MDM   1. Acute  bronchitis, unspecified organism in a smoker    Begin doxycycline for atypical coverage.  Prescription written for Benzonatate [facility]) to take at bedtime for night-time cough.  Refill albuterol MDI.  Rx for Spiriva at patient's request Take plain guaifenesin (  extended release tabs such as Mucinex) twice daily, with plenty of water, for cough and congestion.  May add Pseudoephedrine ( , one or two every 4 to 6 hours) for sinus congestion.  Get adequate rest.   May use Afrin nasal spray (or generic oxymetazoline) twice daily for about 5 days and then discontinue.  Also recommend using saline nasal spray several times daily and saline nasal irrigation (AYR is a common brand).   Try warm salt water gargles for sore throat.  Stop all antihistamines for now, and other non-prescription cough/cold preparations. Follow-up with family doctor if not improving about 7 to10 days.    Lattie Haw, MD 02/01/15 2236

## 2015-02-01 NOTE — Discharge Instructions (Signed)
Take plain guaifenesin (1200mg extended release tabs such as Mucinex) twice daily, with plenty of water, for cough and congestion.  May add Pseudoephedrine (30mg, one or two every 4 to 6 hours) for sinus congestion.  Get adequate rest.   °May use Afrin nasal spray (or generic oxymetazoline) twice daily for about 5 days and then discontinue.  Also recommend using saline nasal spray several times daily and saline nasal irrigation (AYR is a common brand).   °Try warm salt water gargles for sore throat.  °Stop all antihistamines for now, and other non-prescription cough/cold preparations. °Follow-up with family doctor if not improving about 7 to10 days.  °

## 2015-02-04 ENCOUNTER — Telehealth: Payer: Self-pay | Admitting: Emergency Medicine
